# Patient Record
Sex: Female | Born: 1960
Health system: Southern US, Community
[De-identification: ages and names within clinical notes are randomized; demographics above are authoritative.]

## PROBLEM LIST (undated history)

## (undated) DIAGNOSIS — I10 Essential (primary) hypertension: Secondary | ICD-10-CM

## (undated) DIAGNOSIS — E039 Hypothyroidism, unspecified: Secondary | ICD-10-CM

## (undated) DIAGNOSIS — N390 Urinary tract infection, site not specified: Secondary | ICD-10-CM

## (undated) HISTORY — PX: TUBAL LIGATION: SHX77

## (undated) HISTORY — PX: GASTRIC BYPASS: SHX52

---

## 2003-02-25 ENCOUNTER — Encounter: Payer: Self-pay | Admitting: Family Medicine

## 2003-02-25 ENCOUNTER — Ambulatory Visit (HOSPITAL_COMMUNITY): Admission: RE | Admit: 2003-02-25 | Discharge: 2003-02-25 | Payer: Self-pay | Admitting: Family Medicine

## 2005-05-02 ENCOUNTER — Emergency Department (HOSPITAL_COMMUNITY): Admission: EM | Admit: 2005-05-02 | Discharge: 2005-05-02 | Payer: Self-pay | Admitting: *Deleted

## 2005-12-04 ENCOUNTER — Ambulatory Visit (HOSPITAL_COMMUNITY): Admission: RE | Admit: 2005-12-04 | Discharge: 2005-12-04 | Payer: Self-pay | Admitting: Family Medicine

## 2006-09-14 ENCOUNTER — Ambulatory Visit (HOSPITAL_COMMUNITY): Admission: RE | Admit: 2006-09-14 | Discharge: 2006-09-14 | Payer: Self-pay | Admitting: *Deleted

## 2006-09-18 ENCOUNTER — Ambulatory Visit (HOSPITAL_COMMUNITY): Admission: RE | Admit: 2006-09-18 | Discharge: 2006-09-18 | Payer: Self-pay | Admitting: *Deleted

## 2006-10-04 ENCOUNTER — Ambulatory Visit (HOSPITAL_COMMUNITY): Admission: RE | Admit: 2006-10-04 | Discharge: 2006-10-04 | Payer: Self-pay | Admitting: *Deleted

## 2006-10-04 ENCOUNTER — Encounter: Admission: RE | Admit: 2006-10-04 | Discharge: 2006-10-04 | Payer: Self-pay | Admitting: *Deleted

## 2007-01-09 ENCOUNTER — Encounter: Admission: RE | Admit: 2007-01-09 | Discharge: 2007-04-09 | Payer: Self-pay | Admitting: *Deleted

## 2007-04-03 ENCOUNTER — Encounter: Admission: RE | Admit: 2007-04-03 | Discharge: 2007-04-03 | Payer: Self-pay | Admitting: *Deleted

## 2007-04-26 ENCOUNTER — Emergency Department (HOSPITAL_COMMUNITY): Admission: EM | Admit: 2007-04-26 | Discharge: 2007-04-26 | Payer: Self-pay | Admitting: Emergency Medicine

## 2007-08-26 ENCOUNTER — Encounter: Admission: RE | Admit: 2007-08-26 | Discharge: 2007-11-24 | Payer: Self-pay | Admitting: *Deleted

## 2007-09-09 ENCOUNTER — Encounter (INDEPENDENT_AMBULATORY_CARE_PROVIDER_SITE_OTHER): Payer: Self-pay | Admitting: Surgery

## 2007-09-09 ENCOUNTER — Inpatient Hospital Stay (HOSPITAL_COMMUNITY): Admission: RE | Admit: 2007-09-09 | Discharge: 2007-09-11 | Payer: Self-pay | Admitting: Surgery

## 2007-09-10 ENCOUNTER — Ambulatory Visit: Payer: Self-pay | Admitting: Vascular Surgery

## 2007-09-10 ENCOUNTER — Encounter (INDEPENDENT_AMBULATORY_CARE_PROVIDER_SITE_OTHER): Payer: Self-pay | Admitting: Surgery

## 2008-06-01 ENCOUNTER — Ambulatory Visit: Payer: Self-pay | Admitting: Family Medicine

## 2008-06-01 DIAGNOSIS — N898 Other specified noninflammatory disorders of vagina: Secondary | ICD-10-CM | POA: Insufficient documentation

## 2008-06-01 DIAGNOSIS — K069 Disorder of gingiva and edentulous alveolar ridge, unspecified: Secondary | ICD-10-CM

## 2008-06-01 DIAGNOSIS — E039 Hypothyroidism, unspecified: Secondary | ICD-10-CM | POA: Insufficient documentation

## 2008-06-01 DIAGNOSIS — E669 Obesity, unspecified: Secondary | ICD-10-CM

## 2008-06-01 DIAGNOSIS — K056 Periodontal disease, unspecified: Secondary | ICD-10-CM | POA: Insufficient documentation

## 2008-06-01 DIAGNOSIS — I10 Essential (primary) hypertension: Secondary | ICD-10-CM

## 2008-06-01 LAB — CONVERTED CEMR LAB: Beta hcg, urine, semiquantitative: NEGATIVE

## 2008-06-02 ENCOUNTER — Encounter (INDEPENDENT_AMBULATORY_CARE_PROVIDER_SITE_OTHER): Payer: Self-pay | Admitting: Family Medicine

## 2008-06-03 LAB — CONVERTED CEMR LAB
Eosinophils Absolute: 0 10*3/uL (ref 0.0–0.7)
FSH: 19.7 milliintl units/mL
Lymphs Abs: 2.8 10*3/uL (ref 0.7–4.0)
MCV: 94.6 fL (ref 78.0–100.0)
Monocytes Absolute: 0.4 10*3/uL (ref 0.1–1.0)
Neutro Abs: 5.9 10*3/uL (ref 1.7–7.7)
Platelets: 284 10*3/uL (ref 150–400)
RBC: 4.41 M/uL (ref 3.87–5.11)
RDW: 12.8 % (ref 11.5–15.5)
TSH: 4.491 microintl units/mL (ref 0.350–4.50)

## 2008-07-20 ENCOUNTER — Ambulatory Visit: Payer: Self-pay | Admitting: Family Medicine

## 2008-07-20 LAB — CONVERTED CEMR LAB: HDL goal, serum: 40 mg/dL

## 2008-09-08 ENCOUNTER — Encounter (INDEPENDENT_AMBULATORY_CARE_PROVIDER_SITE_OTHER): Payer: Self-pay | Admitting: Family Medicine

## 2008-12-09 ENCOUNTER — Encounter (INDEPENDENT_AMBULATORY_CARE_PROVIDER_SITE_OTHER): Payer: Self-pay | Admitting: Family Medicine

## 2010-03-18 IMAGING — RF DG CHOLANGIOGRAM OPERATIVE
1 series · 4 of 4 positions shown · non-contrast
Comparison: None available

CLINICAL DATA: Cholelithiasis

INTRAOPERATIVE CHOLANGIOGRAM
TECHNIQUE: Multiple fluoroscopic spot radiographs were obtained
during intraoperative cholangiogram and are submitted for
interpretation post-operatively.

[Series 1: run · 4 of 26 frames shown]
[frame 4/26]
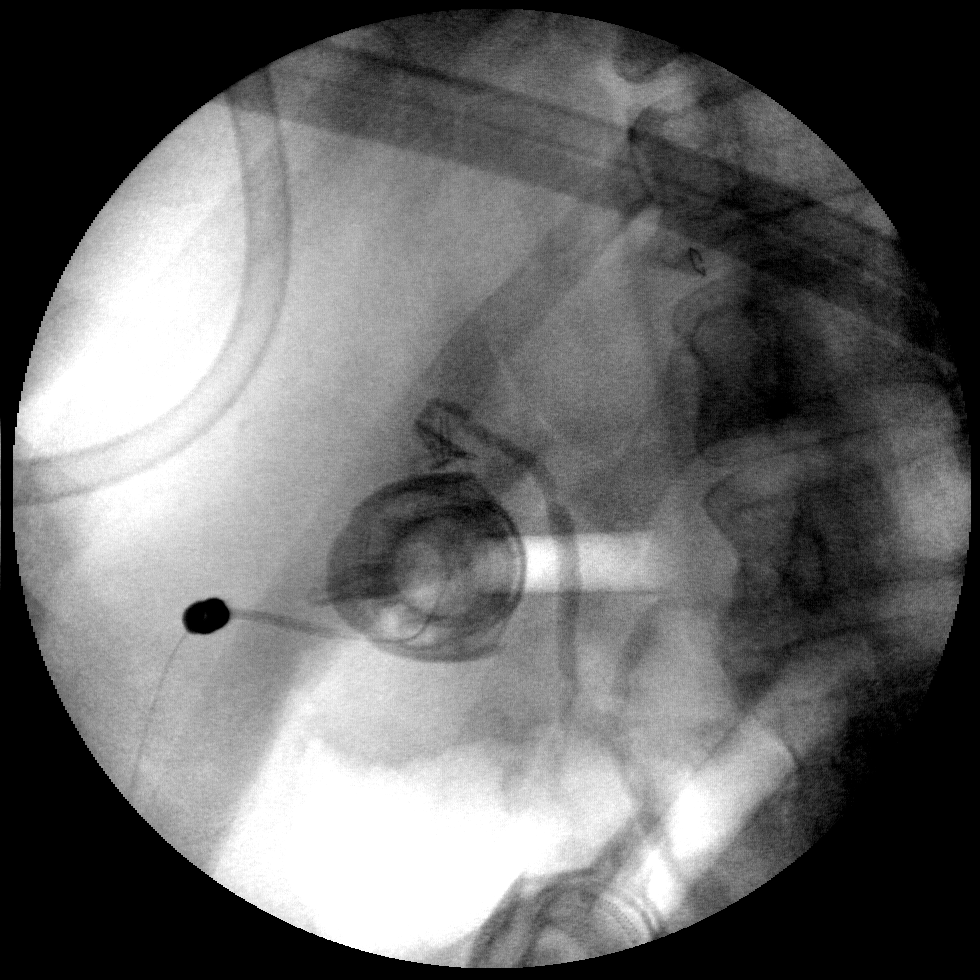
[frame 14/26]
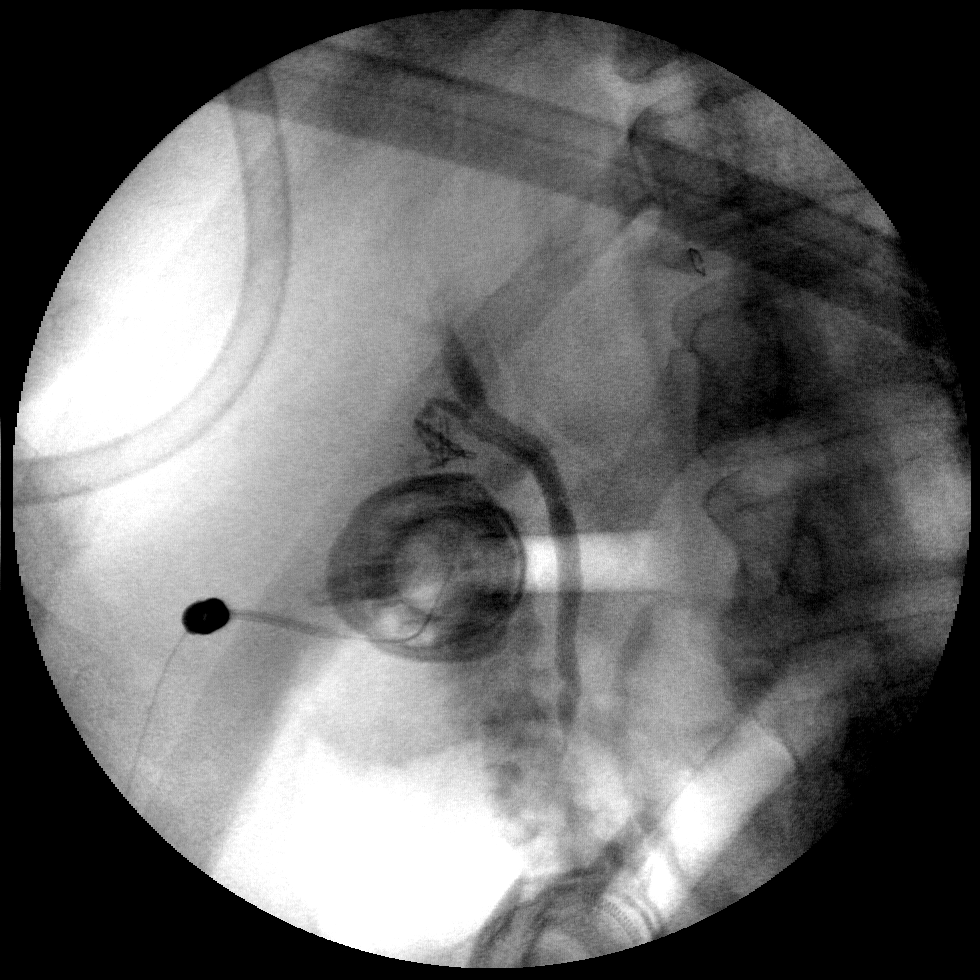
[frame 23/26]
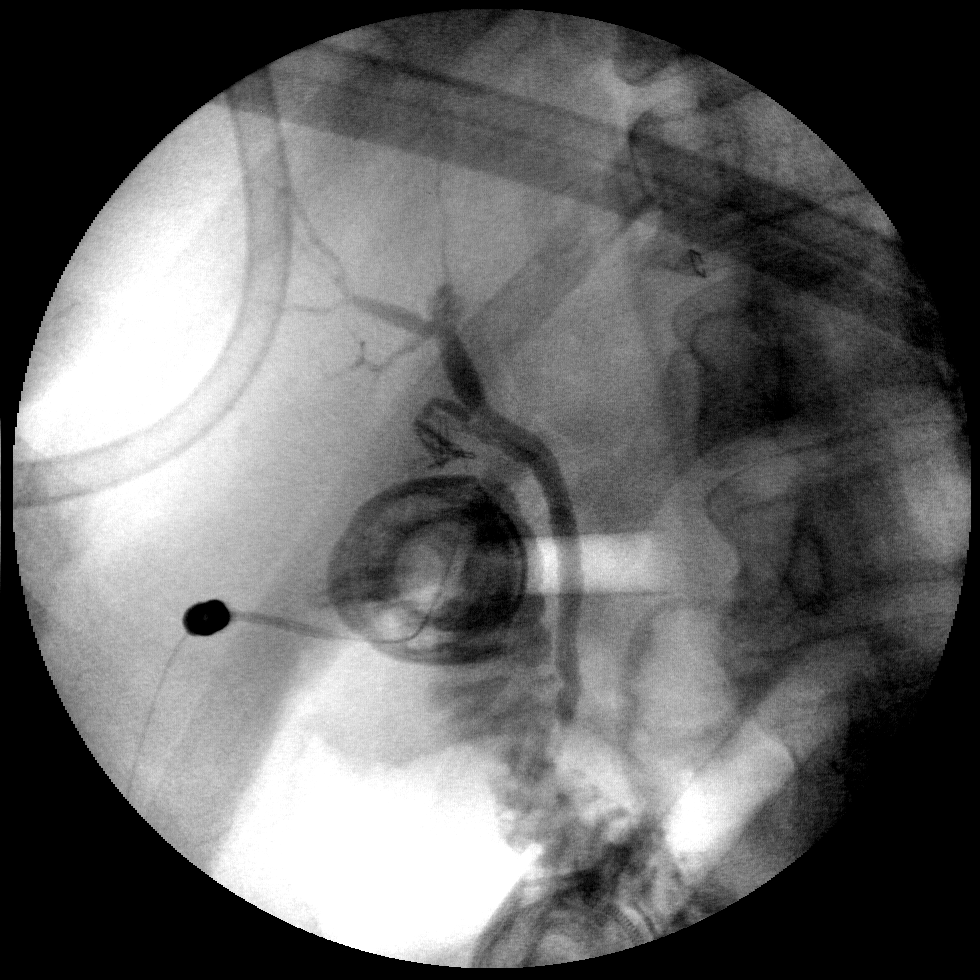
[frame 26/26]
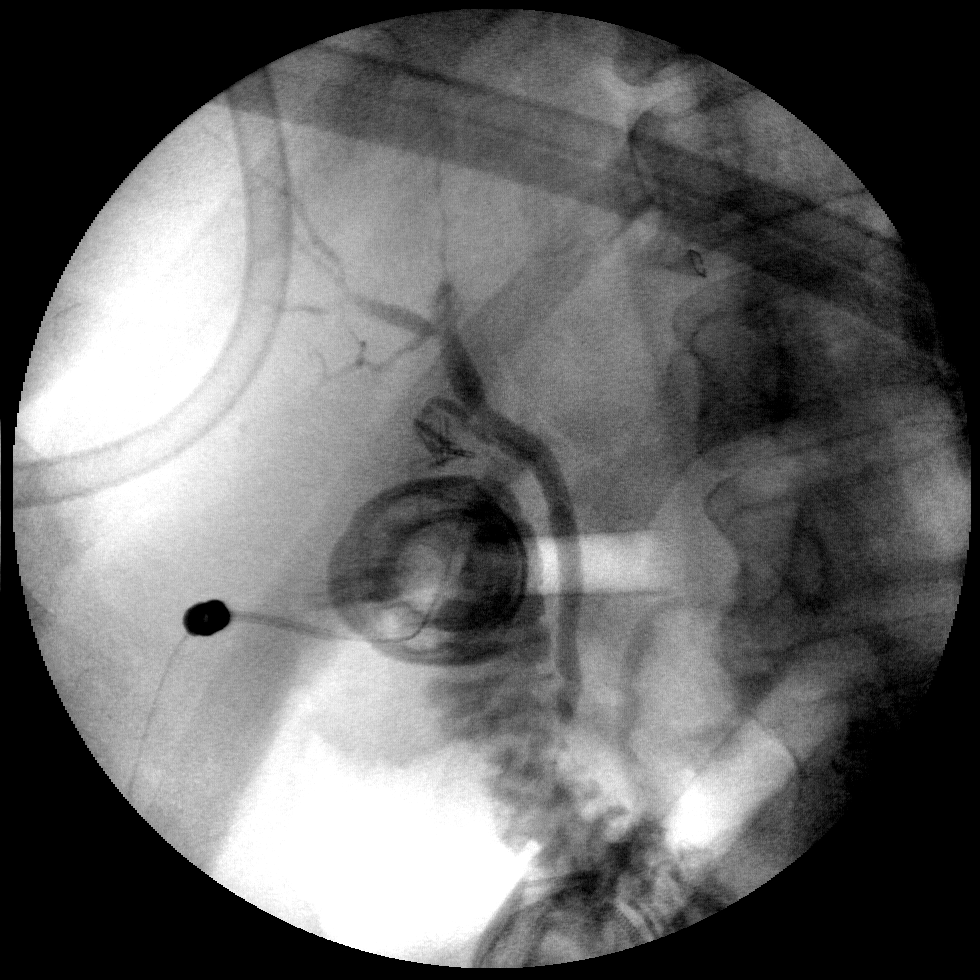

[4 of 4 positions shown; findings below may reference images not displayed]

FINDINGS: 26 images from intraoperative cholangiography are
submitted. No persistent filling defects in the common duct.
Intrahepatic ducts are incompletely opacified. Contrast passes into
the duodenum.

IMPRESSION

Negative for retained common duct stone.

## 2010-10-04 NOTE — Op Note (Signed)
[name], [name]                ACCOUNT NO.:  [id number]   MEDICAL RECORD NO.:  [id number]          PATIENT TYPE:  INP   LOCATION:  0002                         FACILITY:  [facility]   PHYSICIAN:  Sharlet Salina T. Hoxworth, M.D.DATE OF BIRTH:  1960/11/07   DATE OF PROCEDURE:  09/09/2007  DATE OF DISCHARGE:                               OPERATIVE REPORT   PROCEDURE:  Upper GI endoscopy.   DESCRIPTION OF PROCEDURE:  Upper GI endoscopy is performed  intraoperatively at the completion of laparoscopic Roux-en-Y gastric  bypass by Dr. Ovidio Kin.  The Olympus video endoscope was inserted  into the upper esophagus and then passed under direct vision to the EG  junction at 45 cm from the incisors.  The esophagus appeared normal.  The small gastric pouch was then entered.  It was tensely distended with  air while Dr. Ezzard Standing clamped the outlet, and under saline irrigation,  there was no evidence of any leak.  The anastomosis was visualized and  was patent.  The suture and staple lines appeared intact.  There were 2  small polyps 1-2 mm in the proximal gastric pouch that were  photographed.  Following this, the pouch was desufflated and the scope  withdrawn.      Lorne Skeens. Hoxworth, M.D.  Electronically Signed     BTH/MEDQ  D:  09/09/2007  T:  09/09/2007  Job:  [id number]

## 2010-10-04 NOTE — Op Note (Signed)
NAMEEMILYN, [name]                ACCOUNT NO.:  [id number]   MEDICAL RECORD NO.:  [id number]          PATIENT TYPE:  INP   LOCATION:  0002                         FACILITY:  [facility]   PHYSICIAN:  Sandria Bales. Ezzard Standing, M.D.  DATE OF BIRTH:  [birth year]   DATE OF PROCEDURE:  09/09/2007  DATE OF DISCHARGE:                               OPERATIVE REPORT   Operative date ???   PREOPERATIVE DIAGNOSES:  1. Morbid obesity with weight of 278, body mass index 41.2.  2. Gallstones.   POSTOPERATIVE DIAGNOSES:  1. Morbid obesity with weight of 278, body mass index 41.2.  2. Gallstones with chronic cholecystitis.   PROCEDURE:  1. Laparoscopic Roux-Y gastric bypass.  Antecolic, antegastric.      Omentum divided.  2. Upper endoscopy.  3. Laparoscopic cholecystectomy with intraoperative cholangiogram.   SURGEON:  Dr. Ezzard Standing.   FIRST ASSISTANT:  Dr. Jaclynn Guarneri.   ANESTHESIA:  General endotracheal.   ESTIMATED BLOOD LOSS:  Minimal.   INDICATION FOR PROCEDURE:  [name] is a 50 year old white female who  sees Sherie Don, [place], in Dr. Zack Seal office from a medical  standpoint.  She has been obese much of her adult life and has been  through a preop bariatric program who now comes for laparoscopic Roux-Y  gastric bypass.   The indications and potential complications of surgery explained to the  patient.  Potential complications include but not limited bleeding,  infection, bowel leak, pulmonary embolism, long-term nutritional  consequences.   She was also found to have gallstones on her preop evaluation, and I  have discussed with her about going ahead with a laparoscopic  cholecystectomy and cholangiogram.  We also discussed the risks of  cholecystectomy with bleeding, infection, bile duct injury for both  operations, the possibility of open surgery as a possibility.   OPERATIVE NOTE:  The patient placed in the supine position, given a  general endotracheal anesthetic.  Her  abdomen was prepped with Techni-  Care and sterilely draped.  She had a Foley catheter in place.  A time  out was held to identify the patient and the procedure.  We had given  cefoxitin at the initiation of the procedure and accessed her abdomen  with an 11 OptiVu in the left upper quadrant.   I then placed a total of 9 trocars.  She had a 5 mm subxiphoid trocar, a  12 mm right subcostal, a 12 mm right paramedian, a 5 mm right subcostal  for the gallbladder, a 5 mm lateral subcostal for the gallbladder, an 11  mm trocar to the right of the umbilicus, a 12 mm to the left of midline,  and a 5 mm out lateral to the introducing trocar.   Abdominal exploration was carried out.  The right and left lobes of the  liver were unremarkable.  The stomach was unremarkable.  The gallbladder  had some adhesions around it, consistent with chronic cholecystitis.  She had no other mass or lesion within her abdominal cavity.   I first turned my attention to the small bowel, found the ligament of  Treitz and counted 40 cm and divided the small bowel with an  endostapler.  I worried a little bit about ischemic tip of the biliary  limb so actually cut off another 2 cm of that limb with a 2nd firing of  the white load of the Endo-GIA stapler.   We did a side-to-side anastomosis between the small bowel, between the  biliary limb and distal jejunum.  I counted off 100 cm and put a Penrose  drain on this.  I did the anastomosis with a white load of the Endo-GIA  45 mm stapler.  I closed the enterotomy with 2 running 2-0 Vicryl  sutures and put a single buttressing suture in it.  I then closed the  jejunal mesenteric defect with a running 2-0 silk suture with Lap-R-Ty  ties on both ends.  I placed Tisseel over the jejunojejunostomy.   I then turned my attention to the stomach.  I used a Nathenson retractor  to elevate the left lobe of the liver.  I developed a gastric pouch.  First I went along the angle of  His and opened this area.  Then I went  along the lesser curvature, identified about 5 cm below the  gastroesophageal junction and got into the stomach, and that way I did 1  firing of the 45 blue Endo-GIA stapler, got into the lesser sac, and did  3 firings of the 60 mm Ethicon stapler.  This created a pouch  approximately 3 cm in width and 5 cm in length.   I then brought the small bowel/jejunum antecolic, antegastric, and did a  side-to-side posterior wall anastomosis with a running 2-0 Vicryl  suture.  I then made an enterotomy in the stomach, enterotomy in the  small bowel, and did a stapled anastomosis using the blue load of the of  the 45 GIA stapler.  I then closed this gastrojejunostomy with 2 running  2-0 Vicryl sutures and put a single buttressing suture and did an  anterior row of 2-0 Vicryl sutures.  At this time, I had a patent  anastomosis with healthy-looking stomach and small bowel.   Dr. Johna Sheriff broke scrub and went up and endoscoped the patient.  He saw  a pouch which started about 44 cm at the GE junction, went to 49 cm for  the gastrojejunal anastomosis which was widely patent.  He insufflated  with air while I clamped the small bowel.  There was no air leak when I  flooded the upper abdomen with saline.  This looked to be a patent  anastomosis with no evidence of ischemia the stomach or the small bowel.   He also divided the omentum prior to getting the jejunum antecolic  antegastric.   I then placed Tisseel over the gastrojejunostomy and also placed Tisseel  along the new lesser curvature of the new pouch.  I had also oversewn  the distal gastric pouch with a locking 2-0 Vicryl suture.   I thought we had completed the Roux-Y gastric bypass.  The procedure had  gone well.  The anastomosis looked good.   I then turned my attention to the gallbladder.  At this time, we put the  two 5 mm trocars in the right upper quadrant, grabbed the gallbladder.  She had  adhesions of omentum and duodenum stuck up to the anterior wall  of the gallbladder, consistent with chronic cholecystitis.  I then  divided and found the cystic duct gallbladder junction.  I divided the  cystic artery with 2 Endo Clips.  I placed a clip on the gallbladder  side of the cystic duct.   I then shot an intraoperative cholangiogram with a taut catheter.  This  was inserted through a 14 gauge Jelco into the side of the cut cystic  duct and secured with an Endo clip.   I then shot half-strength Hypaque solution into the cystic duct which  traveled down to the common bile duct, and this showed free flow into  the duodenum up the hepatic radicals.  This was felt to be a normal  intraoperative cholangiogram.   I then removed the taut catheter, removed the Endo clips.  I divided the  cystic duct and clipped it 3 times.  I then sharply dissected the  gallbladder from the gallbladder bed.  Prior to dividing the gallbladder  from the gallbladder bed, I revisualized the gallbladder bed.  I  revisualized the triangle of Calot.  I saw no bleeding, no bile leak.  The gallbladder was then divided, placed in EndoCatch bag, and delivered  through the right upper quadrant with 12 mm trocar.   I irrigated the abdomen with a liter of fluid.  I reinspected the  gallbladder bed.  I thought that the suturing had gone well; the bowel  laid lay well.  I then removed the trocars in turn, and there was no  bleeding at any trocar site.  The patient tolerated the procedure well  and was transported to recovery room in good condition.  Sponge and  needle count were correct at the end of the case, and each wound was  then infiltrated about 30 mL of 0.25% Marcaine.  I used staples on the  skin.   The patient was transferred to the recovery room in good condition.      Sandria Bales. Ezzard Standing, M.D.  Electronically Signed     DHN/MEDQ  D:  09/09/2007  T:  09/09/2007  Job:  [id number]   cc:   Terrace Arabia, PA, c/o Dr. Jeannett Senior

## 2011-02-14 LAB — PREGNANCY, URINE: Preg Test, Ur: NEGATIVE

## 2011-02-14 LAB — HEMOGLOBIN AND HEMATOCRIT, BLOOD
HCT: 42.9
HCT: 39.2
HCT: 41.7
Hemoglobin: 13.1
Hemoglobin: 14.2

## 2011-02-14 LAB — CBC
HCT: 37.4
HCT: 39.7
Hemoglobin: 13.6
MCHC: 34
MCV: 90.2
Platelets: 233
RBC: 4.15
RBC: 4.4
RDW: 13.1
WBC: 14.1 — ABNORMAL HIGH
WBC: 9.9

## 2011-02-14 LAB — DIFFERENTIAL
Basophils Absolute: 0
Basophils Relative: 0
Eosinophils Absolute: 0
Eosinophils Relative: 0
Eosinophils Relative: 0
Lymphocytes Relative: 11 — ABNORMAL LOW
Lymphs Abs: 1.6
Monocytes Absolute: 0.6
Monocytes Absolute: 0.7
Neutrophils Relative %: 65
Neutrophils Relative %: 83 — ABNORMAL HIGH

## 2011-02-14 LAB — BASIC METABOLIC PANEL
Chloride: 108
GFR calc Af Amer: >60

## 2011-02-27 LAB — CBC
HCT: 46.4 — ABNORMAL HIGH
MCHC: 33.8
MCV: 90.6
RBC: 5.12 — ABNORMAL HIGH
RDW: 13.2

## 2011-02-27 LAB — BASIC METABOLIC PANEL
BUN: 13
Calcium: 9.3
Chloride: 98
Creatinine, Ser: 0.94
GFR calc non Af Amer: >60
Glucose, Bld: 122 — ABNORMAL HIGH
Sodium: 133 — ABNORMAL LOW

## 2011-02-27 LAB — DIFFERENTIAL
Eosinophils Relative: 0
Lymphocytes Relative: 19
Monocytes Relative: 4
Neutro Abs: 8.9 — ABNORMAL HIGH

## 2011-02-27 LAB — HEPATIC FUNCTION PANEL
AST: 20
Albumin: 4
Bilirubin, Direct: 0.1
Indirect Bilirubin: 0.4
Total Bilirubin: 0.5
Total Protein: 7.5

## 2011-02-27 LAB — URINALYSIS, ROUTINE W REFLEX MICROSCOPIC
Glucose, UA: NEGATIVE
Protein, ur: NEGATIVE
Urobilinogen, UA: 0.2

## 2011-02-27 LAB — LIPASE, BLOOD: Lipase: 17

## 2011-03-13 ENCOUNTER — Telehealth (INDEPENDENT_AMBULATORY_CARE_PROVIDER_SITE_OTHER): Payer: Self-pay | Admitting: Surgery

## 2011-03-13 NOTE — Telephone Encounter (Signed)
Recall letter mailed to patient. Adv pt to call our office to schedule an appt for bariatric surgery f/u...cef °

## 2011-09-18 ENCOUNTER — Telehealth: Payer: Self-pay

## 2011-09-18 DIAGNOSIS — Z139 Encounter for screening, unspecified: Secondary | ICD-10-CM

## 2011-09-18 NOTE — Telephone Encounter (Signed)
Called and LMOM for a return call.  

## 2011-09-19 ENCOUNTER — Other Ambulatory Visit: Payer: Self-pay

## 2011-09-19 DIAGNOSIS — Z139 Encounter for screening, unspecified: Secondary | ICD-10-CM

## 2011-09-19 NOTE — Telephone Encounter (Signed)
LMOM to call.

## 2011-09-20 NOTE — Telephone Encounter (Signed)
Proceed w/ colonoscopy 

## 2011-09-20 NOTE — Telephone Encounter (Signed)
Gastroenterology Pre-Procedure Form    Request Date: 09/19/2011       Requesting Physician: Dr. Dwana Melena     PATIENT INFORMATION:  [name] is a 51 y.o., female (DOB=[birth year]).  PROCEDURE: Procedure(s) requested: colonoscopy Procedure Reason: screening for colon cancer  PATIENT REVIEW QUESTIONS: The patient reports the following:   1. Diabetes Melitis: no 2. Joint replacements in the past 12 months: no 3. Major health problems in the past 3 months: no 4. Has an artificial valve or MVP:no 5. Has been advised in past to take antibiotics in advance of a procedure like teeth cleaning: no}    MEDICATIONS & ALLERGIES:    Patient reports the following regarding taking any blood thinners:   Plavix? no Aspirin?no Coumadin?  no  Patient confirms/reports the following medications:  Current Outpatient Prescriptions  Medication Sig Dispense Refill  . cetirizine (ZYRTEC) 10 MG tablet Take 10 mg by mouth daily. Takes only as needed      . NON FORMULARY Levoxyl    Not sure mg/   Takes one tablet daily      . telmisartan (MICARDIS) 80 MG tablet Take 40 mg by mouth daily.        Patient confirms/reports the following allergies:  No Known Allergies  Patient is appropriate to schedule for requested procedure(s): yes  AUTHORIZATION INFORMATION Primary Insurance:         ID #:   Group #:  Pre-Cert / Auth required: Pre-Cert / Auth #:   Secondary Insurance:   ID #:  Group #:  Pre-Cert / Auth required:  Pre-Cert / Auth #:   No orders of the defined types were placed in this encounter.    SCHEDULE INFORMATION: Procedure has been scheduled as follows:  Date: 10/02/2011    Time: 12:30 PM  Location: [facility] Short Stay  This Gastroenterology Pre-Precedure Form is being routed to the following provider(s) for review: R. Roetta Sessions, MD

## 2011-09-21 MED ORDER — PEG 3350-KCL-NA BICARB-NACL 420 G PO SOLR: ORAL | Status: AC

## 2011-09-21 NOTE — Telephone Encounter (Signed)
Rx and instructions mailed to pt.  

## 2011-09-29 ENCOUNTER — Telehealth: Payer: Self-pay

## 2011-09-29 NOTE — Telephone Encounter (Signed)
Pt called and said that she has a circumstance and is out of town now. Needs to cancel colonoscopy for Mon 10/02/2011 with RMR. She said she will only come home for a few days and will leave again. She will not be home much until later in June. She will call when she returns and is ready to schedule. I called Kim and cancelled procedure.

## 2011-10-02 ENCOUNTER — Encounter (HOSPITAL_COMMUNITY): Admission: RE | Payer: Self-pay | Source: Ambulatory Visit

## 2011-10-02 ENCOUNTER — Ambulatory Visit (HOSPITAL_COMMUNITY)
Admission: RE | Admit: 2011-10-02 | Payer: Managed Care, Other (non HMO) | Source: Ambulatory Visit | Admitting: Internal Medicine

## 2011-10-02 SURGERY — COLONOSCOPY
Anesthesia: Moderate Sedation

## 2012-05-23 ENCOUNTER — Telehealth (INDEPENDENT_AMBULATORY_CARE_PROVIDER_SITE_OTHER): Payer: Self-pay | Admitting: Surgery

## 2012-05-23 NOTE — Telephone Encounter (Signed)
04/12/12 mailed recall letter for bariatric surgery follow-up to pt. Advised pt to call [facility] at [phone] to °schedule appt. (lss) ° °

## 2012-11-21 ENCOUNTER — Other Ambulatory Visit: Payer: Self-pay

## 2012-11-21 ENCOUNTER — Telehealth: Payer: Self-pay

## 2012-11-21 DIAGNOSIS — Z1211 Encounter for screening for malignant neoplasm of colon: Secondary | ICD-10-CM

## 2012-11-25 NOTE — Telephone Encounter (Signed)
Gastroenterology Pre-Procedure Form    Request Date: 11/21/2012    Requesting Physician: Dr. Dwana Melena     PATIENT INFORMATION:  [name] is a 52 y.o., female (DOB=[birth year]).  PROCEDURE: Procedure(s) requested: colonoscopy Procedure Reason: screening for colon cancer  PATIENT REVIEW QUESTIONS: The patient reports the following:   1. Diabetes Melitis: no 2. Joint replacements in the past 12 months: no 3. Major health problems in the past 3 months: no 4. Has an artificial valve or MVP:no 5. Has been advised in past to take antibiotics in advance of a procedure like teeth cleaning: no}    MEDICATIONS & ALLERGIES:    Patient reports the following regarding taking any blood thinners:   Plavix? no Aspirin?no Coumadin?  no  Patient confirms/reports the following medications:  Current Outpatient Prescriptions  Medication Sig Dispense Refill  . levothyroxine (SYNTHROID, LEVOTHROID) 150 MCG tablet Take 150 mcg by mouth daily before breakfast.      . telmisartan (MICARDIS) 80 MG tablet Take 40 mg by mouth daily.      . cetirizine (ZYRTEC) 10 MG tablet Take 10 mg by mouth daily. Takes only as needed       No current facility-administered medications for this visit.    Patient confirms/reports the following allergies:  No Known Allergies  Patient is appropriate to schedule for requested procedure(s): yes  AUTHORIZATION INFORMATION Primary Insurance:   ID #:   Group #:   Pre-Cert / Auth #:   Secondary Insurance:   ID #:   Group #:  Pre-Cert / Auth required:  Pre-Cert / Auth #:   No orders of the defined types were placed in this encounter.    SCHEDULE INFORMATION: Procedure has been scheduled as follows:  Date: 12/27/2012      Time: 8:30 AM  Location: [facility] Short Stay  This Gastroenterology Pre-Precedure Form is being routed to the following provider(s) for review: Jonette Eva, MD

## 2012-11-27 NOTE — Telephone Encounter (Signed)
PREPOPIK-DRINK WATER TO KEEP URINE LIGHT YELLOW.  PT SHOULD DROP OFF RX 3 DAYS PRIOR TO PROCEDURE.  

## 2012-11-29 MED ORDER — SOD PICOSULFATE-MAG OX-CIT ACD 10-3.5-12 MG-GM-GM PO PACK: 1.0000 | PACK | Freq: Once | ORAL | Status: DC

## 2012-11-29 NOTE — Telephone Encounter (Signed)
Rx sent to the pharmacy and instructions mailed to pt.  

## 2012-12-17 ENCOUNTER — Telehealth: Payer: Self-pay

## 2012-12-17 NOTE — Telephone Encounter (Signed)
Called pt. LMOM for a return call to update meds prior to TCS on 12/27/2012.

## 2012-12-18 ENCOUNTER — Telehealth: Payer: Self-pay

## 2012-12-18 NOTE — Telephone Encounter (Signed)
I called [employer] at [phone] and did the automation. PA is not required for screening colonscopy with participating providers. Reference number is [id number].

## 2012-12-20 ENCOUNTER — Encounter (HOSPITAL_COMMUNITY): Payer: Self-pay

## 2012-12-26 ENCOUNTER — Telehealth: Payer: Self-pay

## 2012-12-26 NOTE — Telephone Encounter (Signed)
Pt called. Scheduled for TCS tomorrow. Has misplaced instructions. Faxed copy of instructions to her husband at [phone] per her request.

## 2012-12-26 NOTE — Telephone Encounter (Signed)
Confirmed, instructions received.

## 2012-12-27 ENCOUNTER — Ambulatory Visit (HOSPITAL_COMMUNITY)
Admission: RE | Admit: 2012-12-27 | Discharge: 2012-12-27 | Disposition: A | Discharge status: Home / Self Care | Payer: Managed Care, Other (non HMO) | Source: Ambulatory Visit | Attending: Gastroenterology | Admitting: Gastroenterology

## 2012-12-27 ENCOUNTER — Encounter (HOSPITAL_COMMUNITY): Payer: Self-pay | Admitting: *Deleted

## 2012-12-27 ENCOUNTER — Encounter (HOSPITAL_COMMUNITY)
Admission: RE | Disposition: A | Discharge status: Home / Self Care | Payer: Self-pay | Source: Ambulatory Visit | Attending: Gastroenterology

## 2012-12-27 DIAGNOSIS — Z1211 Encounter for screening for malignant neoplasm of colon: Secondary | ICD-10-CM

## 2012-12-27 DIAGNOSIS — I10 Essential (primary) hypertension: Secondary | ICD-10-CM | POA: Insufficient documentation

## 2012-12-27 DIAGNOSIS — K648 Other hemorrhoids: Secondary | ICD-10-CM

## 2012-12-27 DIAGNOSIS — K5730 Diverticulosis of large intestine without perforation or abscess without bleeding: Secondary | ICD-10-CM

## 2012-12-27 HISTORY — DX: Essential (primary) hypertension: I10

## 2012-12-27 HISTORY — PX: COLONOSCOPY: SHX5424

## 2012-12-27 HISTORY — DX: Hypothyroidism, unspecified: E03.9

## 2012-12-27 HISTORY — DX: Urinary tract infection, site not specified: N39.0

## 2012-12-27 SURGERY — COLONOSCOPY
Anesthesia: Moderate Sedation

## 2012-12-27 MED ORDER — MEPERIDINE HCL 100 MG/ML IJ SOLN
INTRAMUSCULAR | Status: DC | PRN
  Administered 2012-12-27 (×3): 25 mg via INTRAVENOUS

## 2012-12-27 MED ORDER — SODIUM CHLORIDE 0.9 % IV SOLN
INTRAVENOUS | Status: DC
  Administered 2012-12-27: 08:00:00 via INTRAVENOUS

## 2012-12-27 MED ORDER — MEPERIDINE HCL 100 MG/ML IJ SOLN
INTRAMUSCULAR | Status: AC
  Filled 2012-12-27: qty 2

## 2012-12-27 MED ORDER — MIDAZOLAM HCL 5 MG/5ML IJ SOLN
INTRAMUSCULAR | Status: DC | PRN
  Administered 2012-12-27 (×3): 2 mg via INTRAVENOUS

## 2012-12-27 MED ORDER — MIDAZOLAM HCL 5 MG/5ML IJ SOLN
INTRAMUSCULAR | Status: AC
  Filled 2012-12-27: qty 10

## 2012-12-27 MED ORDER — STERILE WATER FOR IRRIGATION IR SOLN
Status: DC | PRN
  Administered 2012-12-27: 09:00:00

## 2012-12-27 NOTE — Op Note (Signed)
[facility] [address] [place], [address]   COLONOSCOPY PROCEDURE REPORT  PATIENT: [name], [name]  MR#: [id number] BIRTHDATE: 1960-09-12 , 51  yrs. old GENDER: Female ENDOSCOPIST: Jonette Eva, MD REFERRED NF:AOZH Hall, M.D. PROCEDURE DATE:  12/27/2012 PROCEDURE:   Colonoscopy, screening INDICATIONS:Average risk patient for colon cancer. MEDICATIONS: Demerol 75 mg IV and Versed 6 mg IV  DESCRIPTION OF PROCEDURE:    Physical exam was performed.  Informed consent was obtained from the patient after explaining the benefits, risks, and alternatives to procedure.  The patient was connected to monitor and placed in left lateral position. Continuous oxygen was provided by nasal cannula and IV medicine administered through an indwelling cannula.  After administration of sedation and rectal exam, the patients rectum was intubated and the EC-3890Li ([id number])  colonoscope was advanced under direct visualization to the ileum.  The scope was removed slowly by carefully examining the color, texture, anatomy, and integrity mucosa on the way out.  The patient was recovered in endoscopy and discharged home in satisfactory condition.     COLON FINDINGS: Mild diverticulosis was noted in the sigmoid colon. , The colon was otherwise normal.  There was no diverticulosis, inflammation, polyps or cancers unless previously stated.  , The mucosa appeared normal in the terminal ileum.  , and Moderate sized internal hemorrhoids were found.  PREP QUALITY: good.    CECAL W/D TIME: 9 minutes     COMPLICATIONS: None  ENDOSCOPIC IMPRESSION: 1.   Mild diverticulosis was noted in the sigmoid colon 2.   Moderate sized internal hemorrhoids  RECOMMENDATIONS: HIGH FIBER DIET TCS IN 10 YEARS       _______________________________ eSignedJonette Eva, MD 12/27/2012 10:30 AM

## 2012-12-27 NOTE — H&P (Signed)
  Primary Care Physician:  Catalina Pizza, MD Primary Gastroenterologist:  Dr. Darrick Penna  Pre-Procedure History & Physical: HPI:  [name] is a 52 y.o. female here for COLON CANCER SCREENING.   Past Medical History  Diagnosis Date  . Hypertension   . Hypothyroidism   . UTI (lower urinary tract infection)     Past Surgical History  Procedure Laterality Date  . Cesarean section      X 2  . Tubal ligation    . Gastric bypass      Prior to Admission medications   Medication Sig Start Date End Date Taking? Authorizing Provider  levothyroxine (SYNTHROID, LEVOTHROID) 150 MCG tablet Take 150 mcg by mouth daily before breakfast.   Yes Historical Provider, MD  Sod Picosulfate-Mag Ox-Cit Acd 10-3.5-12 MG-GM-GM PACK Take 1 kit by mouth once. 11/29/12  Yes West Bali, MD  sulfamethoxazole-trimethoprim (BACTRIM DS) 800-160 MG per tablet Take 1 tablet by mouth 2 (two) times daily.   Yes Historical Provider, MD  telmisartan (MICARDIS) 40 MG tablet Take 40 mg by mouth daily.   Yes Historical Provider, MD    Allergies as of 11/21/2012  . (No Known Allergies)    Family History  Problem Relation Age of Onset  . Colon cancer Neg Hx     History   Social History  . Marital Status: Married    Spouse Name: N/A    Number of Children: N/A  . Years of Education: N/A   Occupational History  . Not on file.   Social History Main Topics  . Smoking status: Former Smoker -- 0.50 packs/day for 20 years    Types: Cigarettes  . Smokeless tobacco: Former Neurosurgeon    Quit date: 07/28/1995  . Alcohol Use: Yes     Comment: occasionally  . Drug Use: No  . Sexually Active: Not on file   Other Topics Concern  . Not on file   Social History Narrative  . No narrative on file    Review of Systems: See HPI, otherwise negative ROS   Physical Exam: BP 139/89  Pulse 75  Temp(Src) 98 F (36.7 C) (Oral)  Resp 24  Ht 5\' 8"  (1.727 m)  Wt 211 lb (95.709 kg)  BMI 32.09 kg/m2  SpO2 98% General:    Alert,  pleasant and cooperative in NAD Head:  Normocephalic and atraumatic. Neck:  Supple; Lungs:  Clear throughout to auscultation.    Heart:  Regular rate and rhythm. Abdomen:  Soft, nontender and nondistended. Normal bowel sounds, without guarding, and without rebound.   Neurologic:  Alert and  oriented x4;  grossly normal neurologically.  Impression/Plan:     SCREENING  Plan:  1. TCS TODAY

## 2012-12-30 ENCOUNTER — Encounter (HOSPITAL_COMMUNITY): Payer: Self-pay | Admitting: Gastroenterology

## 2013-01-01 ENCOUNTER — Other Ambulatory Visit: Payer: Self-pay | Admitting: Obstetrics and Gynecology

## 2014-01-07 ENCOUNTER — Other Ambulatory Visit: Payer: Self-pay | Admitting: Obstetrics and Gynecology

## 2014-01-08 LAB — CYTOLOGY - PAP

## 2016-02-25 ENCOUNTER — Encounter (HOSPITAL_COMMUNITY): Payer: Self-pay

## 2017-04-10 ENCOUNTER — Encounter (HOSPITAL_COMMUNITY): Payer: Self-pay

## 2018-04-04 ENCOUNTER — Encounter (HOSPITAL_COMMUNITY): Payer: Self-pay

## 2019-09-12 ENCOUNTER — Ambulatory Visit: Payer: Self-pay | Attending: Internal Medicine

## 2019-09-12 DIAGNOSIS — Z23 Encounter for immunization: Secondary | ICD-10-CM

## 2019-09-12 NOTE — Progress Notes (Signed)
   Covid-19 Vaccination Clinic  Name:  RECHEL KAPSNER    MRN: [id number] DOB: [birth year]  09/12/2019  Ms. Cooley was observed post Covid-19 immunization for 15 minutes without incident. She was provided with Vaccine Information Sheet and instruction to access the V-Safe system.   Ms. Pirrello was instructed to call 911 with any severe reactions post vaccine: Marland Kitchen Difficulty breathing  . Swelling of face and throat  . A fast heartbeat  . A bad rash all over body  . Dizziness and weakness   Immunizations Administered    Name Date Dose VIS Date Route   Pfizer COVID-19 Vaccine 09/12/2019 10:53 AM 0.3 mL 07/16/2018 Intramuscular   Manufacturer: [place]   Lot: [id number]   [place]: [id number]

## 2019-10-13 ENCOUNTER — Ambulatory Visit: Payer: Self-pay | Attending: Internal Medicine

## 2019-10-13 DIAGNOSIS — Z23 Encounter for immunization: Secondary | ICD-10-CM

## 2019-10-13 NOTE — Progress Notes (Signed)
   Covid-19 Vaccination Clinic  Name:  CLOA BERTZ    MRN: [id number] DOB: 1961-01-22  10/13/2019  Ms. Tatton was observed post Covid-19 immunization for 15 minutes without incident. She was provided with Vaccine Information Sheet and instruction to access the V-Safe system.   Ms. Grzeskowiak was instructed to call 911 with any severe reactions post vaccine: Marland Kitchen Difficulty breathing  . Swelling of face and throat  . A fast heartbeat  . A bad rash all over body  . Dizziness and weakness   Immunizations Administered    Name Date Dose VIS Date Route   Pfizer COVID-19 Vaccine 10/13/2019  8:17 AM 0.3 mL 07/16/2018 Intramuscular   Manufacturer: [place]   Lot: [id number]   [place]: [id number]

## 2019-11-07 DIAGNOSIS — E039 Hypothyroidism, unspecified: Secondary | ICD-10-CM | POA: Diagnosis not present

## 2019-11-07 DIAGNOSIS — E6609 Other obesity due to excess calories: Secondary | ICD-10-CM | POA: Diagnosis not present

## 2019-11-07 DIAGNOSIS — I10 Essential (primary) hypertension: Secondary | ICD-10-CM | POA: Diagnosis not present

## 2019-11-07 DIAGNOSIS — E559 Vitamin D deficiency, unspecified: Secondary | ICD-10-CM | POA: Diagnosis not present

## 2019-11-07 DIAGNOSIS — H00015 Hordeolum externum left lower eyelid: Secondary | ICD-10-CM | POA: Diagnosis not present

## 2019-11-25 DIAGNOSIS — Z0001 Encounter for general adult medical examination with abnormal findings: Secondary | ICD-10-CM | POA: Diagnosis not present

## 2020-02-05 DIAGNOSIS — R300 Dysuria: Secondary | ICD-10-CM | POA: Diagnosis not present

## 2020-02-05 DIAGNOSIS — I10 Essential (primary) hypertension: Secondary | ICD-10-CM | POA: Diagnosis not present

## 2020-02-05 DIAGNOSIS — R350 Frequency of micturition: Secondary | ICD-10-CM | POA: Diagnosis not present

## 2020-02-05 DIAGNOSIS — N390 Urinary tract infection, site not specified: Secondary | ICD-10-CM | POA: Diagnosis not present

## 2020-02-05 DIAGNOSIS — E039 Hypothyroidism, unspecified: Secondary | ICD-10-CM | POA: Diagnosis not present

## 2020-02-05 DIAGNOSIS — Z6836 Body mass index (BMI) 36.0-36.9, adult: Secondary | ICD-10-CM | POA: Diagnosis not present

## 2020-02-05 DIAGNOSIS — N393 Stress incontinence (female) (male): Secondary | ICD-10-CM | POA: Diagnosis not present

## 2020-06-02 ENCOUNTER — Other Ambulatory Visit (HOSPITAL_COMMUNITY): Payer: Self-pay | Admitting: Internal Medicine

## 2020-06-02 DIAGNOSIS — Z1231 Encounter for screening mammogram for malignant neoplasm of breast: Secondary | ICD-10-CM

## 2020-06-02 DIAGNOSIS — E039 Hypothyroidism, unspecified: Secondary | ICD-10-CM | POA: Diagnosis not present

## 2020-06-02 DIAGNOSIS — H9313 Tinnitus, bilateral: Secondary | ICD-10-CM | POA: Diagnosis not present

## 2020-06-02 DIAGNOSIS — I10 Essential (primary) hypertension: Secondary | ICD-10-CM | POA: Diagnosis not present

## 2020-06-02 DIAGNOSIS — L309 Dermatitis, unspecified: Secondary | ICD-10-CM | POA: Diagnosis not present

## 2020-06-02 DIAGNOSIS — Z13820 Encounter for screening for osteoporosis: Secondary | ICD-10-CM

## 2020-07-08 DIAGNOSIS — Z6836 Body mass index (BMI) 36.0-36.9, adult: Secondary | ICD-10-CM | POA: Diagnosis not present

## 2020-07-08 DIAGNOSIS — R300 Dysuria: Secondary | ICD-10-CM | POA: Diagnosis not present

## 2020-07-08 DIAGNOSIS — I10 Essential (primary) hypertension: Secondary | ICD-10-CM | POA: Diagnosis not present

## 2020-07-08 DIAGNOSIS — E039 Hypothyroidism, unspecified: Secondary | ICD-10-CM | POA: Diagnosis not present

## 2020-07-08 DIAGNOSIS — E559 Vitamin D deficiency, unspecified: Secondary | ICD-10-CM | POA: Diagnosis not present

## 2020-07-08 DIAGNOSIS — E6609 Other obesity due to excess calories: Secondary | ICD-10-CM | POA: Diagnosis not present

## 2020-07-08 DIAGNOSIS — E611 Iron deficiency: Secondary | ICD-10-CM | POA: Diagnosis not present

## 2020-09-24 DIAGNOSIS — J069 Acute upper respiratory infection, unspecified: Secondary | ICD-10-CM | POA: Diagnosis not present

## 2020-10-04 DIAGNOSIS — R059 Cough, unspecified: Secondary | ICD-10-CM | POA: Diagnosis not present

## 2020-10-04 DIAGNOSIS — I10 Essential (primary) hypertension: Secondary | ICD-10-CM | POA: Diagnosis not present

## 2020-10-04 DIAGNOSIS — R062 Wheezing: Secondary | ICD-10-CM | POA: Diagnosis not present

## 2020-12-02 DIAGNOSIS — E039 Hypothyroidism, unspecified: Secondary | ICD-10-CM | POA: Diagnosis not present

## 2020-12-02 DIAGNOSIS — R197 Diarrhea, unspecified: Secondary | ICD-10-CM | POA: Diagnosis not present

## 2020-12-02 DIAGNOSIS — E559 Vitamin D deficiency, unspecified: Secondary | ICD-10-CM | POA: Diagnosis not present

## 2020-12-02 DIAGNOSIS — D509 Iron deficiency anemia, unspecified: Secondary | ICD-10-CM | POA: Diagnosis not present

## 2021-01-18 ENCOUNTER — Encounter: Payer: Self-pay | Admitting: Physician Assistant

## 2021-01-18 ENCOUNTER — Other Ambulatory Visit: Payer: Self-pay

## 2021-01-18 ENCOUNTER — Other Ambulatory Visit: Payer: Self-pay | Admitting: Physician Assistant

## 2021-01-18 ENCOUNTER — Ambulatory Visit: Payer: BC Managed Care – PPO | Admitting: Physician Assistant

## 2021-01-18 DIAGNOSIS — L570 Actinic keratosis: Secondary | ICD-10-CM

## 2021-01-18 DIAGNOSIS — L820 Inflamed seborrheic keratosis: Secondary | ICD-10-CM | POA: Diagnosis not present

## 2021-01-18 DIAGNOSIS — Z1283 Encounter for screening for malignant neoplasm of skin: Secondary | ICD-10-CM | POA: Diagnosis not present

## 2021-01-18 DIAGNOSIS — D485 Neoplasm of uncertain behavior of skin: Secondary | ICD-10-CM

## 2021-01-18 NOTE — Progress Notes (Signed)
   New Patient   Subjective  [name] is a 60 y.o. female who presents for the following: Annual Exam. She has never been seen by a dermatologist for skin check (just eczema).  No family history of mm or skin cancer. Right post knee thick crust.  Scale x months also tag like growth left outer eye x years.)   The following portions of the chart were reviewed this encounter and updated as appropriate:  Tobacco  Allergies  Meds  Problems  Med Hx  Surg Hx  Fam Hx      Objective  Well appearing patient in no apparent distress; mood and affect are within normal limits.  A full examination was performed including scalp, head, eyes, ears, nose, lips, neck, chest, axillae, abdomen, back, buttocks, bilateral upper extremities, bilateral lower extremities, hands, feet, fingers, toes, fingernails, and toenails. All findings within normal limits unless otherwise noted below.  Left inner Thigh - Anterior Erythematous patches with gritty scale.  Left Lower Eyelid Verrucous tag      Assessment & Plan  AK (actinic keratosis) Left inner Thigh - Anterior  Destruction of lesion - Left inner Thigh - Anterior Complexity: simple   Destruction method: cryotherapy   Informed consent: discussed and consent obtained   Timeout:  patient name, date of birth, surgical site, and procedure verified Lesion destroyed using liquid nitrogen: Yes   Cryotherapy cycles:  1 Outcome: patient tolerated procedure well with no complications   Post-procedure details: wound care instructions given    Neoplasm of uncertain behavior of skin Left Lower Eyelid  Skin / nail biopsy Type of biopsy: tangential   Informed consent: discussed and consent obtained   Timeout: patient name, date of birth, surgical site, and procedure verified   Anesthesia: the lesion was anesthetized in a standard fashion   Anesthetic:  1% lidocaine w/ epinephrine 1-100,000 local infiltration Instrument used: flexible razor blade    Hemostasis achieved with: aluminum chloride and electrodesiccation   Outcome: patient tolerated procedure well   Post-procedure details: wound care instructions given    Specimen 1 - Surgical pathology Differential Diagnosis: tag v/s wart cautery only  Check Margins: No   No atypical nevi noted at the time of the visit.   I, Arlisa Leclere, PA-C, have reviewed all documentation's for this visit.  The documentation on 01/18/21 for the exam, diagnosis, procedures and orders are all accurate and complete.

## 2021-01-18 NOTE — Patient Instructions (Signed)

## 2021-05-12 DIAGNOSIS — J069 Acute upper respiratory infection, unspecified: Secondary | ICD-10-CM | POA: Diagnosis not present

## 2021-05-13 DIAGNOSIS — U071 COVID-19: Secondary | ICD-10-CM | POA: Diagnosis not present

## 2021-05-13 DIAGNOSIS — Z20822 Contact with and (suspected) exposure to covid-19: Secondary | ICD-10-CM | POA: Diagnosis not present

## 2021-08-08 DIAGNOSIS — B349 Viral infection, unspecified: Secondary | ICD-10-CM | POA: Diagnosis not present

## 2021-08-08 DIAGNOSIS — M791 Myalgia, unspecified site: Secondary | ICD-10-CM | POA: Diagnosis not present

## 2021-09-02 DIAGNOSIS — I10 Essential (primary) hypertension: Secondary | ICD-10-CM | POA: Diagnosis not present

## 2021-09-02 DIAGNOSIS — E611 Iron deficiency: Secondary | ICD-10-CM | POA: Diagnosis not present

## 2021-09-02 DIAGNOSIS — E039 Hypothyroidism, unspecified: Secondary | ICD-10-CM | POA: Diagnosis not present

## 2021-09-02 DIAGNOSIS — E559 Vitamin D deficiency, unspecified: Secondary | ICD-10-CM | POA: Diagnosis not present

## 2021-09-08 DIAGNOSIS — E559 Vitamin D deficiency, unspecified: Secondary | ICD-10-CM | POA: Diagnosis not present

## 2021-09-08 DIAGNOSIS — E039 Hypothyroidism, unspecified: Secondary | ICD-10-CM | POA: Diagnosis not present

## 2021-09-08 DIAGNOSIS — D509 Iron deficiency anemia, unspecified: Secondary | ICD-10-CM | POA: Diagnosis not present

## 2021-09-08 DIAGNOSIS — I10 Essential (primary) hypertension: Secondary | ICD-10-CM | POA: Diagnosis not present

## 2021-09-13 ENCOUNTER — Other Ambulatory Visit (HOSPITAL_COMMUNITY): Payer: Self-pay | Admitting: Internal Medicine

## 2021-09-13 DIAGNOSIS — E782 Mixed hyperlipidemia: Secondary | ICD-10-CM

## 2021-10-04 ENCOUNTER — Other Ambulatory Visit (HOSPITAL_COMMUNITY): Payer: Self-pay | Admitting: Internal Medicine

## 2021-10-04 DIAGNOSIS — E782 Mixed hyperlipidemia: Secondary | ICD-10-CM

## 2021-10-14 ENCOUNTER — Ambulatory Visit (HOSPITAL_COMMUNITY): Payer: BC Managed Care – PPO

## 2021-10-20 ENCOUNTER — Ambulatory Visit (HOSPITAL_COMMUNITY)
Admission: RE | Admit: 2021-10-20 | Discharge: 2021-10-20 | Disposition: A | Discharge status: Home / Self Care | Payer: BC Managed Care – PPO | Source: Ambulatory Visit | Attending: Internal Medicine | Admitting: Internal Medicine

## 2021-10-20 DIAGNOSIS — E782 Mixed hyperlipidemia: Secondary | ICD-10-CM | POA: Insufficient documentation

## 2021-12-08 ENCOUNTER — Ambulatory Visit: Payer: BC Managed Care – PPO | Admitting: Orthopedic Surgery

## 2021-12-08 ENCOUNTER — Encounter: Payer: Self-pay | Admitting: Orthopedic Surgery

## 2021-12-08 ENCOUNTER — Ambulatory Visit (INDEPENDENT_AMBULATORY_CARE_PROVIDER_SITE_OTHER): Payer: BC Managed Care – PPO

## 2021-12-08 VITALS — BP 159/78 | HR 68 | Ht 68.0 in | Wt 225.0 lb

## 2021-12-08 DIAGNOSIS — M7731 Calcaneal spur, right foot: Secondary | ICD-10-CM

## 2021-12-08 DIAGNOSIS — M722 Plantar fascial fibromatosis: Secondary | ICD-10-CM

## 2021-12-08 DIAGNOSIS — M79671 Pain in right foot: Secondary | ICD-10-CM

## 2021-12-08 MED ORDER — MELOXICAM 7.5 MG PO TABS: 7.5000 mg | ORAL_TABLET | Freq: Every day | ORAL | 0 refills | Status: DC

## 2021-12-08 NOTE — Progress Notes (Signed)
Chief Complaint  Patient presents with   Foot Pain    Right    Chief Complaint  Patient presents with   Foot Pain    Right     HPI: [name] is 61 years old she presents with pain on the bottom of her foot which is exacerbated by sitting and getting back up or putting her foot down early in the morning.  She has had pain for over 4 weeks now.  She reports a history of previous Planter fasciitis symptoms years ago  Past Medical History:  Diagnosis Date   Hypertension    Hypothyroidism    UTI (lower urinary tract infection)     BP (!) 159/78   Pulse 68   Ht '5\' 8"'$  (1.727 m)   Wt 225 lb (102.1 kg)   BMI 34.21 kg/m    General appearance: Well-developed well-nourished no gross deformities  Cardiovascular normal pulse and perfusion normal color without edema  Neurologically no sensation loss or deficits or pathologic reflexes  Psychological: Awake alert and oriented x3 mood and affect normal  Skin no lacerations or ulcerations no nodularity no palpable masses, no erythema or nodularity  Musculoskeletal: No abnormality in gait.  Tenderness is directly over the plantar aspect of the foot foot alignment is normal skin is good.  Achilles is nontender  Imaging imaging does show a plantar spur see my report  A/P  Encounter Diagnoses  Name Primary?   Pain in right foot Yes   Plantar fasciitis     Recommend ice Recommend stretching Recommend NSAIDs Return in 6 weeks   Meds ordered this encounter  Medications   meloxicam (MOBIC) 7.5 MG tablet    Sig: Take 1 tablet (7.5 mg total) by mouth daily.    Dispense:  30 tablet    Refill:  0    .

## 2021-12-09 DIAGNOSIS — D509 Iron deficiency anemia, unspecified: Secondary | ICD-10-CM | POA: Diagnosis not present

## 2021-12-09 DIAGNOSIS — I10 Essential (primary) hypertension: Secondary | ICD-10-CM | POA: Diagnosis not present

## 2021-12-09 DIAGNOSIS — E039 Hypothyroidism, unspecified: Secondary | ICD-10-CM | POA: Diagnosis not present

## 2021-12-09 DIAGNOSIS — F17200 Nicotine dependence, unspecified, uncomplicated: Secondary | ICD-10-CM | POA: Diagnosis not present

## 2022-01-14 ENCOUNTER — Other Ambulatory Visit: Payer: Self-pay | Admitting: Orthopedic Surgery

## 2022-01-14 DIAGNOSIS — M722 Plantar fascial fibromatosis: Secondary | ICD-10-CM

## 2022-01-19 ENCOUNTER — Ambulatory Visit: Payer: BC Managed Care – PPO | Admitting: Physician Assistant

## 2022-01-19 ENCOUNTER — Encounter: Payer: Self-pay | Admitting: Orthopedic Surgery

## 2022-01-19 ENCOUNTER — Ambulatory Visit: Payer: BC Managed Care – PPO | Admitting: Orthopedic Surgery

## 2022-01-19 DIAGNOSIS — M79671 Pain in right foot: Secondary | ICD-10-CM | POA: Diagnosis not present

## 2022-01-19 DIAGNOSIS — M722 Plantar fascial fibromatosis: Secondary | ICD-10-CM

## 2022-01-19 NOTE — Patient Instructions (Signed)
Continue present management

## 2022-01-19 NOTE — Progress Notes (Signed)
FOLLOW UP   Encounter Diagnoses  Name Primary?   Plantar fasciitis Yes   Pain in right foot      Chief Complaint  Patient presents with   Foot Pain    Right/ feels better     61 year old female with Planter fasciitis feels much better  She was real tender over the heel when I palpated but she says it is doing well she is walking without any problems so I am going to advise her to continue her present course of stretching and icing and follow-up with [place] as needed

## 2022-02-02 DIAGNOSIS — I10 Essential (primary) hypertension: Secondary | ICD-10-CM | POA: Diagnosis not present

## 2022-02-12 ENCOUNTER — Other Ambulatory Visit: Payer: Self-pay | Admitting: Orthopedic Surgery

## 2022-02-12 DIAGNOSIS — M722 Plantar fascial fibromatosis: Secondary | ICD-10-CM

## 2022-03-06 DIAGNOSIS — E669 Obesity, unspecified: Secondary | ICD-10-CM | POA: Diagnosis not present

## 2022-03-06 DIAGNOSIS — E559 Vitamin D deficiency, unspecified: Secondary | ICD-10-CM | POA: Diagnosis not present

## 2022-03-06 DIAGNOSIS — R7301 Impaired fasting glucose: Secondary | ICD-10-CM | POA: Diagnosis not present

## 2022-03-06 DIAGNOSIS — E039 Hypothyroidism, unspecified: Secondary | ICD-10-CM | POA: Diagnosis not present

## 2022-03-10 DIAGNOSIS — E039 Hypothyroidism, unspecified: Secondary | ICD-10-CM | POA: Diagnosis not present

## 2022-03-10 DIAGNOSIS — D509 Iron deficiency anemia, unspecified: Secondary | ICD-10-CM | POA: Diagnosis not present

## 2022-03-10 DIAGNOSIS — Z23 Encounter for immunization: Secondary | ICD-10-CM | POA: Diagnosis not present

## 2022-03-10 DIAGNOSIS — I10 Essential (primary) hypertension: Secondary | ICD-10-CM | POA: Diagnosis not present

## 2022-03-10 DIAGNOSIS — E559 Vitamin D deficiency, unspecified: Secondary | ICD-10-CM | POA: Diagnosis not present

## 2022-03-22 ENCOUNTER — Other Ambulatory Visit: Payer: Self-pay | Admitting: Orthopedic Surgery

## 2022-03-22 DIAGNOSIS — M722 Plantar fascial fibromatosis: Secondary | ICD-10-CM

## 2022-03-30 DIAGNOSIS — M722 Plantar fascial fibromatosis: Secondary | ICD-10-CM | POA: Diagnosis not present

## 2022-03-30 DIAGNOSIS — I10 Essential (primary) hypertension: Secondary | ICD-10-CM | POA: Diagnosis not present

## 2022-03-30 DIAGNOSIS — E669 Obesity, unspecified: Secondary | ICD-10-CM | POA: Diagnosis not present

## 2022-03-30 DIAGNOSIS — F17200 Nicotine dependence, unspecified, uncomplicated: Secondary | ICD-10-CM | POA: Diagnosis not present

## 2022-04-23 ENCOUNTER — Other Ambulatory Visit: Payer: Self-pay | Admitting: Orthopedic Surgery

## 2022-04-23 DIAGNOSIS — M722 Plantar fascial fibromatosis: Secondary | ICD-10-CM

## 2022-05-23 ENCOUNTER — Other Ambulatory Visit: Payer: Self-pay | Admitting: Orthopedic Surgery

## 2022-05-23 DIAGNOSIS — M722 Plantar fascial fibromatosis: Secondary | ICD-10-CM

## 2022-05-30 DIAGNOSIS — Z01419 Encounter for gynecological examination (general) (routine) without abnormal findings: Secondary | ICD-10-CM | POA: Diagnosis not present

## 2022-05-30 DIAGNOSIS — Z6838 Body mass index (BMI) 38.0-38.9, adult: Secondary | ICD-10-CM | POA: Diagnosis not present

## 2022-05-30 DIAGNOSIS — Z124 Encounter for screening for malignant neoplasm of cervix: Secondary | ICD-10-CM | POA: Diagnosis not present

## 2022-05-30 DIAGNOSIS — Z1231 Encounter for screening mammogram for malignant neoplasm of breast: Secondary | ICD-10-CM | POA: Diagnosis not present

## 2022-05-31 DIAGNOSIS — Z20822 Contact with and (suspected) exposure to covid-19: Secondary | ICD-10-CM | POA: Diagnosis not present

## 2022-05-31 DIAGNOSIS — J029 Acute pharyngitis, unspecified: Secondary | ICD-10-CM | POA: Diagnosis not present

## 2022-07-02 ENCOUNTER — Other Ambulatory Visit: Payer: Self-pay | Admitting: Orthopedic Surgery

## 2022-07-02 DIAGNOSIS — M722 Plantar fascial fibromatosis: Secondary | ICD-10-CM

## 2022-07-20 ENCOUNTER — Encounter: Payer: Self-pay | Admitting: Radiology

## 2022-08-06 ENCOUNTER — Other Ambulatory Visit: Payer: Self-pay | Admitting: Orthopedic Surgery

## 2022-08-06 DIAGNOSIS — M722 Plantar fascial fibromatosis: Secondary | ICD-10-CM

## 2022-09-06 ENCOUNTER — Other Ambulatory Visit: Payer: Self-pay | Admitting: Orthopedic Surgery

## 2022-09-06 DIAGNOSIS — M722 Plantar fascial fibromatosis: Secondary | ICD-10-CM

## 2022-10-05 ENCOUNTER — Other Ambulatory Visit: Payer: Self-pay | Admitting: Orthopedic Surgery

## 2022-10-05 DIAGNOSIS — M722 Plantar fascial fibromatosis: Secondary | ICD-10-CM

## 2022-10-17 DIAGNOSIS — E039 Hypothyroidism, unspecified: Secondary | ICD-10-CM | POA: Diagnosis not present

## 2022-10-17 DIAGNOSIS — E669 Obesity, unspecified: Secondary | ICD-10-CM | POA: Diagnosis not present

## 2022-10-17 DIAGNOSIS — R7301 Impaired fasting glucose: Secondary | ICD-10-CM | POA: Diagnosis not present

## 2022-10-17 DIAGNOSIS — E559 Vitamin D deficiency, unspecified: Secondary | ICD-10-CM | POA: Diagnosis not present

## 2022-10-24 DIAGNOSIS — F17200 Nicotine dependence, unspecified, uncomplicated: Secondary | ICD-10-CM | POA: Diagnosis not present

## 2022-10-24 DIAGNOSIS — E669 Obesity, unspecified: Secondary | ICD-10-CM | POA: Diagnosis not present

## 2022-10-24 DIAGNOSIS — M722 Plantar fascial fibromatosis: Secondary | ICD-10-CM | POA: Diagnosis not present

## 2022-10-24 DIAGNOSIS — I10 Essential (primary) hypertension: Secondary | ICD-10-CM | POA: Diagnosis not present

## 2022-11-22 DIAGNOSIS — Z713 Dietary counseling and surveillance: Secondary | ICD-10-CM | POA: Diagnosis not present

## 2022-11-22 DIAGNOSIS — F17200 Nicotine dependence, unspecified, uncomplicated: Secondary | ICD-10-CM | POA: Diagnosis not present

## 2022-11-22 DIAGNOSIS — I10 Essential (primary) hypertension: Secondary | ICD-10-CM | POA: Diagnosis not present

## 2022-12-01 ENCOUNTER — Encounter: Payer: Self-pay | Admitting: *Deleted

## 2022-12-20 DIAGNOSIS — J069 Acute upper respiratory infection, unspecified: Secondary | ICD-10-CM | POA: Diagnosis not present

## 2022-12-20 DIAGNOSIS — R5383 Other fatigue: Secondary | ICD-10-CM | POA: Diagnosis not present

## 2022-12-20 DIAGNOSIS — R197 Diarrhea, unspecified: Secondary | ICD-10-CM | POA: Diagnosis not present

## 2023-04-03 DIAGNOSIS — B372 Candidiasis of skin and nail: Secondary | ICD-10-CM | POA: Diagnosis not present

## 2023-04-03 DIAGNOSIS — J069 Acute upper respiratory infection, unspecified: Secondary | ICD-10-CM | POA: Diagnosis not present

## 2023-04-03 DIAGNOSIS — E669 Obesity, unspecified: Secondary | ICD-10-CM | POA: Diagnosis not present

## 2023-04-03 DIAGNOSIS — I10 Essential (primary) hypertension: Secondary | ICD-10-CM | POA: Diagnosis not present

## 2023-04-03 DIAGNOSIS — F17200 Nicotine dependence, unspecified, uncomplicated: Secondary | ICD-10-CM | POA: Diagnosis not present

## 2023-04-07 ENCOUNTER — Other Ambulatory Visit: Payer: Self-pay | Admitting: Orthopedic Surgery

## 2023-04-07 DIAGNOSIS — M722 Plantar fascial fibromatosis: Secondary | ICD-10-CM

## 2023-04-18 DIAGNOSIS — E559 Vitamin D deficiency, unspecified: Secondary | ICD-10-CM | POA: Diagnosis not present

## 2023-04-18 DIAGNOSIS — E669 Obesity, unspecified: Secondary | ICD-10-CM | POA: Diagnosis not present

## 2023-04-18 DIAGNOSIS — E039 Hypothyroidism, unspecified: Secondary | ICD-10-CM | POA: Diagnosis not present

## 2023-04-18 DIAGNOSIS — R7301 Impaired fasting glucose: Secondary | ICD-10-CM | POA: Diagnosis not present

## 2023-04-25 DIAGNOSIS — R7301 Impaired fasting glucose: Secondary | ICD-10-CM | POA: Diagnosis not present

## 2023-04-25 DIAGNOSIS — I10 Essential (primary) hypertension: Secondary | ICD-10-CM | POA: Diagnosis not present

## 2023-04-25 DIAGNOSIS — E039 Hypothyroidism, unspecified: Secondary | ICD-10-CM | POA: Diagnosis not present

## 2023-04-25 DIAGNOSIS — M722 Plantar fascial fibromatosis: Secondary | ICD-10-CM | POA: Diagnosis not present

## 2023-04-25 DIAGNOSIS — F17200 Nicotine dependence, unspecified, uncomplicated: Secondary | ICD-10-CM | POA: Diagnosis not present

## 2023-06-29 ENCOUNTER — Other Ambulatory Visit (HOSPITAL_COMMUNITY): Payer: Self-pay

## 2023-09-04 DIAGNOSIS — M25551 Pain in right hip: Secondary | ICD-10-CM | POA: Diagnosis not present

## 2023-10-05 DIAGNOSIS — J0100 Acute maxillary sinusitis, unspecified: Secondary | ICD-10-CM | POA: Diagnosis not present

## 2023-10-05 DIAGNOSIS — M25551 Pain in right hip: Secondary | ICD-10-CM | POA: Diagnosis not present

## 2023-10-05 DIAGNOSIS — M542 Cervicalgia: Secondary | ICD-10-CM | POA: Diagnosis not present

## 2023-10-05 DIAGNOSIS — I10 Essential (primary) hypertension: Secondary | ICD-10-CM | POA: Diagnosis not present

## 2023-10-05 DIAGNOSIS — J302 Other seasonal allergic rhinitis: Secondary | ICD-10-CM | POA: Diagnosis not present

## 2023-10-18 DIAGNOSIS — E039 Hypothyroidism, unspecified: Secondary | ICD-10-CM | POA: Diagnosis not present

## 2023-10-18 DIAGNOSIS — I10 Essential (primary) hypertension: Secondary | ICD-10-CM | POA: Diagnosis not present

## 2023-10-18 DIAGNOSIS — E782 Mixed hyperlipidemia: Secondary | ICD-10-CM | POA: Diagnosis not present

## 2023-10-18 DIAGNOSIS — R7301 Impaired fasting glucose: Secondary | ICD-10-CM | POA: Diagnosis not present

## 2023-10-18 DIAGNOSIS — E559 Vitamin D deficiency, unspecified: Secondary | ICD-10-CM | POA: Diagnosis not present

## 2023-10-22 ENCOUNTER — Telehealth: Payer: Self-pay

## 2023-10-22 NOTE — Telephone Encounter (Signed)
  Who is your primary care physician: DR.ZACK HALL  Reasons for the colonoscopy: SCREENING  Have you had a colonoscopy before?  12/27/2012 DR.ROURKE  Do you have family history of colon cancer? NO  Previous colonoscopy with polyps removed? NO  Do you have a history colorectal cancer?   NO  Are you diabetic? If yes, Type 1 or Type 2?    NO  Do you have a prosthetic or mechanical heart valve? NO  Do you have a pacemaker/defibrillator?   NO  Have you had endocarditis/atrial fibrillation? NO  Have you had joint replacement within the last 12 months?  NO  Do you tend to be constipated or have to use laxatives? NO  Do you have any history of drugs or alchohol?  NO  Do you use supplemental oxygen?  NO  Have you had a stroke or heart attack within the last 6 months? NO  Do you take weight loss medication?  NO  For female patients: have you had a hysterectomy?  NO                                     are you post menopausal?       YES                                            do you still have your menstrual cycle? NO      Do you take any blood-thinning medications such as: (aspirin, warfarin, Plavix, Aggrenox)  NO  If yes we need the name, milligram, dosage and who is prescribing doctor  Current Outpatient Medications on File Prior to Visit  Medication Sig Dispense Refill   Cholecalciferol (VITAMIN D-3) 25 MCG (1000 UT) CAPS Take by mouth.     loratadine (CLARITIN) 10 MG tablet Take 10 mg by mouth daily.     Multiple Vitamins-Minerals (CENTRUM VITAMINTS PO) Take by mouth.     buPROPion (WELLBUTRIN XL) 150 MG 24 hr tablet Take 150 mg by mouth daily.     levothyroxine (SYNTHROID, LEVOTHROID) 150 MCG tablet Take 150 mcg by mouth daily before breakfast.     meloxicam  (MOBIC ) 7.5 MG tablet TAKE 1 TABLET BY MOUTH EVERY DAY 30 tablet 5   telmisartan (MICARDIS) 40 MG tablet Take 40 mg by mouth daily.     No current facility-administered medications on file prior to visit.    No Known  Allergies   Pharmacy: [facility] [place]  Primary Insurance Name: Estel Heir [id number]  Best number where you can be reached: [phone]

## 2023-11-01 DIAGNOSIS — Z01419 Encounter for gynecological examination (general) (routine) without abnormal findings: Secondary | ICD-10-CM | POA: Diagnosis not present

## 2023-11-01 DIAGNOSIS — I10 Essential (primary) hypertension: Secondary | ICD-10-CM | POA: Diagnosis not present

## 2023-11-01 DIAGNOSIS — N393 Stress incontinence (female) (male): Secondary | ICD-10-CM | POA: Diagnosis not present

## 2023-11-01 DIAGNOSIS — N3281 Overactive bladder: Secondary | ICD-10-CM | POA: Diagnosis not present

## 2023-11-01 DIAGNOSIS — E782 Mixed hyperlipidemia: Secondary | ICD-10-CM | POA: Diagnosis not present

## 2023-11-01 DIAGNOSIS — D509 Iron deficiency anemia, unspecified: Secondary | ICD-10-CM | POA: Diagnosis not present

## 2023-11-01 DIAGNOSIS — E039 Hypothyroidism, unspecified: Secondary | ICD-10-CM | POA: Diagnosis not present

## 2023-11-01 DIAGNOSIS — M25551 Pain in right hip: Secondary | ICD-10-CM | POA: Diagnosis not present

## 2023-11-01 DIAGNOSIS — Z1231 Encounter for screening mammogram for malignant neoplasm of breast: Secondary | ICD-10-CM | POA: Diagnosis not present

## 2023-11-01 DIAGNOSIS — J302 Other seasonal allergic rhinitis: Secondary | ICD-10-CM | POA: Diagnosis not present

## 2023-11-01 NOTE — Telephone Encounter (Signed)
 LMOVM to return call.

## 2023-11-01 NOTE — Telephone Encounter (Signed)
Ok to schedule. Asa 2. 

## 2023-11-06 ENCOUNTER — Encounter: Payer: Self-pay | Admitting: *Deleted

## 2023-11-06 ENCOUNTER — Other Ambulatory Visit: Payer: Self-pay | Admitting: *Deleted

## 2023-11-06 MED ORDER — PEG 3350-KCL-NA BICARB-NACL 420 G PO SOLR: 4000.0000 mL | Freq: Once | ORAL | 0 refills | Status: AC

## 2023-11-06 NOTE — Telephone Encounter (Signed)
 Pt has been scheduled with Dr.Rourk for 12/06/23. Instructions mailed and prep sent to pharmacy.

## 2023-11-13 NOTE — Telephone Encounter (Signed)
 Questionnaire from recall, no referral needed

## 2023-11-16 DIAGNOSIS — R053 Chronic cough: Secondary | ICD-10-CM | POA: Diagnosis not present

## 2023-11-16 DIAGNOSIS — R399 Unspecified symptoms and signs involving the genitourinary system: Secondary | ICD-10-CM | POA: Diagnosis not present

## 2023-11-26 DIAGNOSIS — N393 Stress incontinence (female) (male): Secondary | ICD-10-CM | POA: Diagnosis not present

## 2023-11-26 DIAGNOSIS — N8189 Other female genital prolapse: Secondary | ICD-10-CM | POA: Diagnosis not present

## 2023-11-26 DIAGNOSIS — N3941 Urge incontinence: Secondary | ICD-10-CM | POA: Diagnosis not present

## 2023-11-29 ENCOUNTER — Other Ambulatory Visit: Payer: Self-pay | Admitting: Medical Genetics

## 2023-12-06 ENCOUNTER — Encounter (HOSPITAL_COMMUNITY)
Admission: RE | Disposition: A | Discharge status: Home / Self Care | Payer: Self-pay | Source: Home / Self Care | Attending: Internal Medicine

## 2023-12-06 ENCOUNTER — Other Ambulatory Visit: Payer: Self-pay

## 2023-12-06 ENCOUNTER — Ambulatory Visit (HOSPITAL_COMMUNITY): Admitting: Anesthesiology

## 2023-12-06 ENCOUNTER — Encounter (HOSPITAL_COMMUNITY): Payer: Self-pay | Admitting: Internal Medicine

## 2023-12-06 ENCOUNTER — Ambulatory Visit (HOSPITAL_COMMUNITY)
Admission: RE | Admit: 2023-12-06 | Discharge: 2023-12-06 | Disposition: A | Discharge status: Home / Self Care | Attending: Internal Medicine | Admitting: Internal Medicine

## 2023-12-06 DIAGNOSIS — Z87891 Personal history of nicotine dependence: Secondary | ICD-10-CM | POA: Diagnosis not present

## 2023-12-06 DIAGNOSIS — Z1211 Encounter for screening for malignant neoplasm of colon: Secondary | ICD-10-CM

## 2023-12-06 DIAGNOSIS — E039 Hypothyroidism, unspecified: Secondary | ICD-10-CM | POA: Diagnosis not present

## 2023-12-06 DIAGNOSIS — I10 Essential (primary) hypertension: Secondary | ICD-10-CM | POA: Diagnosis not present

## 2023-12-06 HISTORY — PX: COLONOSCOPY: SHX5424

## 2023-12-06 SURGERY — COLONOSCOPY
Anesthesia: General

## 2023-12-06 MED ORDER — LACTATED RINGERS IV SOLN
INTRAVENOUS | Status: DC
  Administered 2023-12-06: 500 mL via INTRAVENOUS

## 2023-12-06 MED ORDER — EPHEDRINE SULFATE-NACL 50-0.9 MG/10ML-% IV SOSY
PREFILLED_SYRINGE | INTRAVENOUS | Status: DC | PRN
  Administered 2023-12-06: 5 mg via INTRAVENOUS

## 2023-12-06 MED ORDER — PROPOFOL 500 MG/50ML IV EMUL
INTRAVENOUS | Status: DC | PRN
  Administered 2023-12-06: 150 mg via INTRAVENOUS
  Administered 2023-12-06: 100 ug/kg/min via INTRAVENOUS

## 2023-12-06 NOTE — Discharge Instructions (Signed)
  Colonoscopy Discharge Instructions  Read the instructions outlined below and refer to this sheet in the next few weeks. These discharge instructions provide you with general information on caring for yourself after you leave the hospital. Your doctor may also give you specific instructions. While your treatment has been planned according to the most current medical practices available, unavoidable complications occasionally occur. If you have any problems or questions after discharge, call Dr. Shaaron at [phone]. ACTIVITY You may resume your regular activity, but move at a slower pace for the next 24 hours.  Take frequent rest periods for the next 24 hours.  Walking will help get rid of the air and reduce the bloated feeling in your belly (abdomen).  No driving for 24 hours (because of the medicine (anesthesia) used during the test).   Do not sign any important legal documents or operate any machinery for 24 hours (because of the anesthesia used during the test).  NUTRITION Drink plenty of fluids.  You may resume your normal diet as instructed by your doctor.  Begin with a light meal and progress to your normal diet. Heavy or fried foods are harder to digest and may make you feel sick to your stomach (nauseated).  Avoid alcoholic beverages for 24 hours or as instructed.  MEDICATIONS You may resume your normal medications unless your doctor tells you otherwise.  WHAT YOU CAN EXPECT TODAY Some feelings of bloating in the abdomen.  Passage of more gas than usual.  Spotting of blood in your stool or on the toilet paper.  IF YOU HAD POLYPS REMOVED DURING THE COLONOSCOPY: No aspirin products for 7 days or as instructed.  No alcohol for 7 days or as instructed.  Eat a soft diet for the next 24 hours.  FINDING OUT THE RESULTS OF YOUR TEST Not all test results are available during your visit. If your test results are not back during the visit, make an appointment with your caregiver to find out the  results. Do not assume everything is normal if you have not heard from your caregiver or the medical facility. It is important for you to follow up on all of your test results.  SEEK IMMEDIATE MEDICAL ATTENTION IF: You have more than a spotting of blood in your stool.  Your belly is swollen (abdominal distention).  You are nauseated or vomiting.  You have a temperature over 101.  You have abdominal pain or discomfort that is severe or gets worse throughout the day.     No polyps found today  It is recommended he return for 1 more screening colonoscopy in 10 years

## 2023-12-06 NOTE — Anesthesia Preprocedure Evaluation (Signed)
 Anesthesia Evaluation  Patient identified by MRN, date of birth, ID band Patient awake    Reviewed: Allergy & Precautions, H&P , NPO status , Patient's Chart, lab work & pertinent test results, reviewed documented beta blocker date and time   Airway Mallampati: II  TM Distance: >3 FB Neck ROM: full    Dental no notable dental hx. (+) Dental Advisory Given, Teeth Intact   Pulmonary neg pulmonary ROS   Pulmonary exam normal breath sounds clear to auscultation       Cardiovascular Exercise Tolerance: Good hypertension, Normal cardiovascular exam Rhythm:regular Rate:Normal     Neuro/Psych negative neurological ROS  negative psych ROS   GI/Hepatic negative GI ROS, Neg liver ROS,,,  Endo/Other  Hypothyroidism    Renal/GU negative Renal ROS  negative genitourinary   Musculoskeletal   Abdominal   Peds  Hematology negative hematology ROS (+)   Anesthesia Other Findings   Reproductive/Obstetrics negative OB ROS                             Anesthesia Physical Anesthesia Plan  ASA: 2  Anesthesia Plan: General   Post-op Pain Management: Minimal or no pain anticipated   Induction: Intravenous  PONV Risk Score and Plan: Propofol infusion  Airway Management Planned: Nasal Cannula and Natural Airway  Additional Equipment: None  Intra-op Plan:   Post-operative Plan:   Informed Consent: I have reviewed the patients History and Physical, chart, labs and discussed the procedure including the risks, benefits and alternatives for the proposed anesthesia with the patient or authorized representative who has indicated his/her understanding and acceptance.     Dental Advisory Given  Plan Discussed with: CRNA  Anesthesia Plan Comments:        Anesthesia Quick Evaluation

## 2023-12-06 NOTE — Anesthesia Procedure Notes (Signed)
 Date/Time: 12/06/2023 8:25 AM  Performed by: Barbarann Verneita RAMAN, CRNAPre-anesthesia Checklist: Patient identified, Emergency Drugs available, Suction available, Timeout performed and Patient being monitored Patient Re-evaluated:Patient Re-evaluated prior to induction Oxygen Delivery Method: Nasal Cannula

## 2023-12-06 NOTE — Op Note (Signed)
 [facility] Patient Name: [name] Procedure Date: 12/06/2023 8:13 AM MRN: [id number] Date of Birth: 07-25-1960 Attending MD: Lamar Ozell Hollingshead , MD, [id number] CSN: [id number] Age: 63 Admit Type: Outpatient Procedure:                Colonoscopy Indications:              Screening for colorectal malignant neoplasm Providers:                Lamar Ozell Hollingshead, MD, Rosina Sprague, Bascom Blush Referring MD:              Medicines:                Propofol  per Anesthesia Complications:            No immediate complications. Estimated Blood Loss:     Estimated blood loss: none. Procedure:                Pre-Anesthesia Assessment:                           - Prior to the procedure, a History and Physical                            was performed, and patient medications and                            allergies were reviewed. The patient's tolerance of                            previous anesthesia was also reviewed. The risks                            and benefits of the procedure and the sedation                            options and risks were discussed with the patient.                            All questions were answered, and informed consent                            was obtained. Prior Anticoagulants: The patient has                            taken no anticoagulant or antiplatelet agents. ASA                            Grade Assessment: II - A patient with mild systemic                            disease. After reviewing the risks and benefits,                            the patient was deemed in satisfactory condition to  undergo the procedure.                           After obtaining informed consent, the colonoscope                            was passed under direct vision. Throughout the                            procedure, the patient's blood pressure, pulse, and                            oxygen saturations were monitored  continuously. The                            [id number]) scope was introduced through the                            anus and advanced to the the cecum, identified by                            appendiceal orifice and ileocecal valve. The                            colonoscopy was performed without difficulty. The                            patient tolerated the procedure well. The quality                            of the bowel preparation was adequate. The                            ileocecal valve, appendiceal orifice, and rectum                            were photographed. Scope In: 8:32:45 AM Scope Out: 8:46:23 AM Scope Withdrawal Time: 0 hours 7 minutes 57 seconds  Total Procedure Duration: 0 hours 13 minutes 38 seconds  Findings:      The perianal and digital rectal examinations were normal.      The colon (entire examined portion) appeared normal.      The retroflexed view of the distal rectum and anal verge was normal and       showed no anal or rectal abnormalities. Impression:               - The entire examined colon is normal.                           - The distal rectum and anal verge are normal on                            retroflexion view.                           -  No specimens collected. Moderate Sedation:      Moderate (conscious) sedation was personally administered by an       anesthesia professional. The following parameters were monitored: oxygen       saturation, heart rate, blood pressure, respiratory rate, EKG, adequacy       of pulmonary ventilation, and response to care. Recommendation:           - Patient has a contact number available for                            emergencies. The signs and symptoms of potential                            delayed complications were discussed with the                            patient. Return to normal activities tomorrow.                            Written discharge instructions were provided to the                             patient.                           - Advance diet as tolerated.                           - Continue present medications.                           - Repeat colonoscopy in 10 years for screening                            purposes.                           - Return to GI office (date not yet determined). Procedure Code(s):        --- Professional ---                           [phone], Colonoscopy, flexible; diagnostic, including                            collection of specimen(s) by brushing or washing,                            when performed (separate procedure) Diagnosis Code(s):        --- Professional ---                           Z12.11, Encounter for screening for malignant                            neoplasm of colon CPT copyright 2022 American Medical Association. All rights reserved. The codes documented in this report are preliminary and  upon coder review may  be revised to meet current compliance requirements. Lamar HERO. Deshay Kirstein, MD Lamar Ozell Hollingshead, MD 12/06/2023 9:01:07 AM This report has been signed electronically. Number of Addenda: 0

## 2023-12-06 NOTE — Transfer of Care (Signed)
 Immediate Anesthesia Transfer of Care Note  Patient: [name]  Procedure(s) Performed: COLONOSCOPY  Patient Location: Endoscopy Unit  Anesthesia Type:General  Level of Consciousness: awake and patient cooperative  Airway & Oxygen Therapy: Patient Spontanous Breathing  Post-op Assessment: Report given to RN and Post -op Vital signs reviewed and stable  Post vital signs: Reviewed and stable  Last Vitals:  Vitals Value Taken Time  BP 94/43 12/06/23 08:49  Temp 36.6 C 12/06/23 08:49  Pulse 53 12/06/23 08:49  Resp 15 12/06/23 08:49  SpO2 97 % 12/06/23 08:49    Last Pain:  Vitals:   12/06/23 0849  TempSrc: Oral  PainSc: 0-No pain      Patients Stated Pain Goal: 8 (12/06/23 0813)  Complications: No notable events documented.

## 2023-12-06 NOTE — Anesthesia Postprocedure Evaluation (Signed)
 Anesthesia Post Note  Patient: [name]  Procedure(s) Performed: COLONOSCOPY  Patient location during evaluation: Endoscopy Anesthesia Type: General Level of consciousness: awake and alert Pain management: pain level controlled Vital Signs Assessment: post-procedure vital signs reviewed and stable Respiratory status: spontaneous breathing, nonlabored ventilation and respiratory function stable Cardiovascular status: blood pressure returned to baseline and stable Postop Assessment: no apparent nausea or vomiting Anesthetic complications: no   There were no known notable events for this encounter.   Last Vitals:  Vitals:   12/06/23 0849 12/06/23 0853  BP: (!) 94/43 (!) 104/46  Pulse: (!) 53 (!) 57  Resp: 15 15  Temp: 36.6 C   SpO2: 97% 98%    Last Pain:  Vitals:   12/06/23 0849  TempSrc: Oral  PainSc: 0-No pain                 Callie Facey L Zavior Thomason

## 2023-12-06 NOTE — H&P (Signed)
 @LOGO @   Primary Care Physician:  Shona Norleen PEDLAR, MD Primary Gastroenterologist:  Dr. Shaaron  Pre-Procedure History & Physical: HPI:  [name] is a 63 y.o. female here for screening colonoscopy.  Sigmoid diverticulosis found in 2014.  Here for updated screening examination.  Past Medical History:  Diagnosis Date   Hypertension    Hypothyroidism    UTI (lower urinary tract infection)     Past Surgical History:  Procedure Laterality Date   CESAREAN SECTION     X 2   COLONOSCOPY N/A 12/27/2012   Procedure: COLONOSCOPY;  Surgeon: Margo LITTIE Haddock, MD;  Location: [facility];  Service: Endoscopy;  Laterality: N/A;  8:30 AM    GASTRIC BYPASS     TUBAL LIGATION      Prior to Admission medications   Medication Sig Start Date End Date Taking? Authorizing Provider  amLODipine (NORVASC) 5 MG tablet Take 5 mg by mouth daily.   Yes [provider]  buPROPion (WELLBUTRIN XL) 150 MG 24 hr tablet Take 150 mg by mouth daily. 01/14/22  Yes [provider]  Cholecalciferol (VITAMIN D-3) 25 MCG (1000 UT) CAPS Take by mouth.   Yes [provider]  fexofenadine (ALLEGRA) 60 MG tablet Take 100 mg by mouth daily.   Yes [provider]  levothyroxine (SYNTHROID, LEVOTHROID) 150 MCG tablet Take 150 mcg by mouth daily before breakfast.   Yes [provider]  meloxicam  (MOBIC ) 7.5 MG tablet TAKE 1 TABLET BY MOUTH EVERY DAY 04/09/23  Yes Margrette Taft BRAVO, MD  Multiple Vitamins-Minerals (CENTRUM VITAMINTS PO) Take by mouth.   Yes [provider]  NON FORMULARY 25 mg daily. Miradegron   Yes [provider]  telmisartan (MICARDIS) 40 MG tablet Take 80 mg by mouth daily.   Yes [provider]  loratadine (CLARITIN) 10 MG tablet Take 10 mg by mouth daily.    [provider]    Allergies as of 11/06/2023   (No Known Allergies)    Family History  Problem Relation Age of Onset   Colon cancer Neg Hx     Social History    Socioeconomic History   Marital status: Married    Spouse name: Not on file   Number of children: Not on file   Years of education: Not on file   Highest education level: Not on file  Occupational History   Not on file  Tobacco Use   Smoking status: Former    Current packs/day: 0.50    Average packs/day: 0.5 packs/day for 20.0 years (10.0 ttl pk-yrs)    Types: Cigarettes   Smokeless tobacco: Former    Quit date: 07/28/1995  Vaping Use   Vaping status: Every Day  Substance and Sexual Activity   Alcohol use: Yes    Comment: occasionally   Drug use: No   Sexual activity: Not on file  Other Topics Concern   Not on file  Social History Narrative   Not on file   Social Drivers of Health   Financial Resource Strain: Not on file  Food Insecurity: Not on file  Transportation Needs: Not on file  Physical Activity: Not on file  Stress: Not on file  Social Connections: Not on file  Intimate Partner Violence: Not on file    Review of Systems: See HPI, otherwise negative ROS  Physical Exam: BP (!) 151/70   Pulse 61   Temp 97.7 F (36.5 C) (Oral)   Resp 12   Ht 5' 7 (  1.702 m)   Wt 104.3 kg   SpO2 100%   BMI 36.02 kg/m  General:   Alert,  Well-developed, well-nourished, pleasant and cooperative in NAD Mouth:  No deformity or lesions. Neck:  Supple; no masses or thyromegaly. No significant cervical adenopathy. Lungs:  Clear throughout to auscultation.   No wheezes, crackles, or rhonchi. No acute distress. Heart:  Regular rate and rhythm; no murmurs, clicks, rubs,  or gallops. Abdomen: Non-distended, normal bowel sounds.  Soft and nontender without appreciable mass or hepatosplenomegaly.   Impression/Plan: 63 year old lady here for average risk screening colonoscopy. The risks, benefits, limitations, alternatives and imponderables have been reviewed with the patient. Questions have been answered. All parties are agreeable.       Notice: This dictation was prepared  with Dragon dictation along with smaller phrase technology. Any transcriptional errors that result from this process are unintentional and may not be corrected upon review.

## 2023-12-07 ENCOUNTER — Encounter (HOSPITAL_COMMUNITY): Payer: Self-pay | Admitting: Internal Medicine

## 2023-12-11 DIAGNOSIS — N3941 Urge incontinence: Secondary | ICD-10-CM | POA: Diagnosis not present

## 2023-12-11 DIAGNOSIS — N8189 Other female genital prolapse: Secondary | ICD-10-CM | POA: Diagnosis not present

## 2023-12-11 DIAGNOSIS — N393 Stress incontinence (female) (male): Secondary | ICD-10-CM | POA: Diagnosis not present

## 2023-12-25 ENCOUNTER — Other Ambulatory Visit: Payer: Self-pay | Admitting: Orthopedic Surgery

## 2023-12-25 DIAGNOSIS — M722 Plantar fascial fibromatosis: Secondary | ICD-10-CM

## 2024-03-14 ENCOUNTER — Other Ambulatory Visit: Payer: Self-pay | Admitting: Medical Genetics

## 2024-03-14 DIAGNOSIS — Z006 Encounter for examination for normal comparison and control in clinical research program: Secondary | ICD-10-CM
